# Patient Record
Sex: Male | Born: 1960 | Race: White | Hispanic: No | Marital: Married | Smoking: Never smoker
Health system: Southern US, Community
[De-identification: ages and names within clinical notes are randomized; demographics above are authoritative.]

## PROBLEM LIST (undated history)

## (undated) DIAGNOSIS — G039 Meningitis, unspecified: Secondary | ICD-10-CM

## (undated) DIAGNOSIS — I10 Essential (primary) hypertension: Secondary | ICD-10-CM

---

## 2011-06-08 ENCOUNTER — Emergency Department (HOSPITAL_BASED_OUTPATIENT_CLINIC_OR_DEPARTMENT_OTHER)
Admission: EM | Admit: 2011-06-08 | Discharge: 2011-06-08 | Disposition: A | Discharge status: Home / Self Care | Payer: PRIVATE HEALTH INSURANCE | Attending: Emergency Medicine | Admitting: Emergency Medicine

## 2011-06-08 ENCOUNTER — Emergency Department (INDEPENDENT_AMBULATORY_CARE_PROVIDER_SITE_OTHER): Payer: PRIVATE HEALTH INSURANCE

## 2011-06-08 DIAGNOSIS — D72829 Elevated white blood cell count, unspecified: Secondary | ICD-10-CM | POA: Insufficient documentation

## 2011-06-08 DIAGNOSIS — R079 Chest pain, unspecified: Secondary | ICD-10-CM

## 2011-06-08 DIAGNOSIS — R0789 Other chest pain: Secondary | ICD-10-CM | POA: Insufficient documentation

## 2011-06-08 DIAGNOSIS — E785 Hyperlipidemia, unspecified: Secondary | ICD-10-CM | POA: Insufficient documentation

## 2011-06-08 DIAGNOSIS — F17200 Nicotine dependence, unspecified, uncomplicated: Secondary | ICD-10-CM | POA: Insufficient documentation

## 2011-06-08 DIAGNOSIS — R000 Tachycardia, unspecified: Secondary | ICD-10-CM | POA: Insufficient documentation

## 2011-06-08 DIAGNOSIS — I10 Essential (primary) hypertension: Secondary | ICD-10-CM | POA: Insufficient documentation

## 2011-06-08 LAB — COMPREHENSIVE METABOLIC PANEL
ALT: 42 U/L (ref 0–53)
AST: 25 U/L (ref 0–37)
Alkaline Phosphatase: 87 U/L (ref 39–117)
CO2: 25 mEq/L (ref 19–32)
Chloride: 95 mEq/L — ABNORMAL LOW (ref 96–112)
GFR calc Af Amer: >60 mL/min (ref >60)
GFR calc non Af Amer: >60 mL/min (ref >60)
Glucose, Bld: 115 mg/dL — ABNORMAL HIGH (ref 70–99)
Potassium: 3.7 mEq/L (ref 3.5–5.1)
Sodium: 133 mEq/L — ABNORMAL LOW (ref 135–145)
Total Bilirubin: 0.2 mg/dL — ABNORMAL LOW (ref 0.3–1.2)

## 2011-06-08 LAB — DIFFERENTIAL
Eosinophils Relative: 0 % (ref 0–5)
Lymphocytes Relative: 21 % (ref 12–46)
Lymphs Abs: 3 10*3/uL (ref 0.7–4.0)
Neutro Abs: 9.9 10*3/uL — ABNORMAL HIGH (ref 1.7–7.7)

## 2011-06-08 LAB — CBC
Hemoglobin: 14.3 g/dL (ref 13.0–17.0)
MCH: 32.9 pg (ref 26.0–34.0)
Platelets: 367 10*3/uL (ref 150–400)
RBC: 4.34 MIL/uL (ref 4.22–5.81)
WBC: 14.4 10*3/uL — ABNORMAL HIGH (ref 4.0–10.5)

## 2012-01-17 ENCOUNTER — Encounter (HOSPITAL_BASED_OUTPATIENT_CLINIC_OR_DEPARTMENT_OTHER): Payer: Self-pay | Admitting: *Deleted

## 2012-01-17 ENCOUNTER — Emergency Department (HOSPITAL_BASED_OUTPATIENT_CLINIC_OR_DEPARTMENT_OTHER)
Admission: EM | Admit: 2012-01-17 | Discharge: 2012-01-17 | Disposition: A | Discharge status: Home / Self Care | Payer: PRIVATE HEALTH INSURANCE | Attending: Emergency Medicine | Admitting: Emergency Medicine

## 2012-01-17 ENCOUNTER — Emergency Department (INDEPENDENT_AMBULATORY_CARE_PROVIDER_SITE_OTHER): Payer: PRIVATE HEALTH INSURANCE

## 2012-01-17 DIAGNOSIS — X838XXA Intentional self-harm by other specified means, initial encounter: Secondary | ICD-10-CM

## 2012-01-17 DIAGNOSIS — S52599A Other fractures of lower end of unspecified radius, initial encounter for closed fracture: Secondary | ICD-10-CM | POA: Insufficient documentation

## 2012-01-17 DIAGNOSIS — S5290XA Unspecified fracture of unspecified forearm, initial encounter for closed fracture: Secondary | ICD-10-CM | POA: Insufficient documentation

## 2012-01-17 DIAGNOSIS — S52609A Unspecified fracture of lower end of unspecified ulna, initial encounter for closed fracture: Secondary | ICD-10-CM

## 2012-01-17 DIAGNOSIS — Y92009 Unspecified place in unspecified non-institutional (private) residence as the place of occurrence of the external cause: Secondary | ICD-10-CM | POA: Insufficient documentation

## 2012-01-17 HISTORY — DX: Meningitis, unspecified: G03.9

## 2012-01-17 HISTORY — DX: Essential (primary) hypertension: I10

## 2012-01-17 MED ORDER — HYDROCODONE-ACETAMINOPHEN 5-325 MG PO TABS
2.0000 | ORAL_TABLET | Freq: Once | ORAL | Status: AC
  Administered 2012-01-17: 2 via ORAL
  Filled 2012-01-17: qty 2

## 2012-01-17 MED ORDER — HYDROCODONE-ACETAMINOPHEN 5-325 MG PO TABS: 2.0000 | ORAL_TABLET | ORAL | Status: AC | PRN

## 2012-01-17 NOTE — ED Notes (Signed)
Pt states he hit the refrigerator with his right hand. Now c/o pain in his right wrist. Hx breaks x 3 to same. +radial pulse. Moves fingers. Feels touch. Cap refill < 3sec. Splinted and ice applied at triage.

## 2012-01-17 NOTE — ED Notes (Signed)
Pt out to speak with RN staff.  Pt VERY rude and offensive to staff.  Explained to pt that we are very full and the EDP will see him when available.  Further explained that we had done all we could (xray, ice and splinting) until evaluated by EDP.  Pt again remains rude and hateful in tone and manner.  K. [place], [place] made aware of pts displeasure.

## 2012-01-17 NOTE — ED Provider Notes (Signed)
History     CSN: [id number]  Arrival date & time 01/17/12  1648   First MD Initiated Contact with Patient 01/17/12 1720      Chief Complaint  Patient presents with  . Wrist Injury    (Consider location/radiation/quality/duration/timing/severity/associated sxs/prior treatment) Patient is a 51 y.o. male presenting with wrist pain. The history is provided by the patient. No language interpreter was used.  Wrist Pain This is a new problem. The current episode started today. The problem occurs constantly. The problem has been unchanged. Associated symptoms include joint swelling. Pertinent negatives include no numbness. The symptoms are aggravated by bending. He has tried ice and immobilization for the symptoms. The treatment provided moderate relief.  Pt hit a refrigerator.  Pt complains of pain to his wrist and forearm  Past Medical History  Diagnosis Date  . Hypertension   . Meningitis spinal     History reviewed. No pertinent past surgical history.  History reviewed. No pertinent family history.  History  Substance Use Topics  . Smoking status: Never Smoker   . Smokeless tobacco: Not on file  . Alcohol Use: Yes      Review of Systems  Musculoskeletal: Positive for joint swelling.  Neurological: Negative for numbness.  All other systems reviewed and are negative.    Allergies  Penicillins  Home Medications   Current Outpatient Rx  Name Route Sig Dispense Refill  . LIPITOR PO Oral Take 1 tablet by mouth daily.    Marland Kitchen ESOMEPRAZOLE MAGNESIUM 40 MG PO CPDR Oral Take 40 mg by mouth daily before breakfast.    . PRESCRIPTION MEDICATION Oral Take 2 tablets by mouth daily. Blood pressure medication      BP 145/94  Pulse 94  Temp(Src) 97.3 F (36.3 C) (Oral)  Resp 20  Ht 5\' 10"  (1.778 m)  Wt 200 lb (90.719 kg)  BMI 28.70 kg/m2  SpO2 96%  Physical Exam  Nursing note and vitals reviewed. Constitutional: He appears well-developed. He appears distressed.  HENT:    Head: Normocephalic and atraumatic.  Musculoskeletal: He exhibits edema and tenderness.       Swollen tender right wrist,  nv and ns intact,  From wrist and fingers  Neurological: He is alert.  Skin: Skin is warm.  Psychiatric: He has a normal mood and affect.    ED Course  Procedures (including critical care time)  Labs Reviewed - No data to display Dg Wrist Complete Right  01/17/2012  *RADIOLOGY REPORT*  Clinical Data: The patient punched a refrigerator and now has wrist pain.  RIGHT WRIST - COMPLETE 3+ VIEW  Comparison: None.  Findings: Laurence Compton fracture of the distal radius noted, with some foreshortening of the radius compared to the ulna, fracture extending into the posterior jugular margin of the distal radius, and only mild proximal migration of the radial articular fragment and the hand.  There also appears to be a relatively nondisplaced longitudinal fracture plane extending proximally in the distal radius.  On the oblique view, there is a questionable fracture of the base of the ulnar styloid.  IMPRESSION:  1.  Minimally displaced Barton fracture of the distal radius 2.  Mild proximal longitudinal fracture of the distal radial metadiaphysis. 3.  Equivocal fracture at the base of the ulnar styloid.  Original Report Authenticated By: Dellia Cloud, M.D.     No diagnosis found.    MDM  Pt placed in splint. I advised follow jup with Hand surgeon for evaluation  Langston Masker, [place] 01/17/12 1925

## 2012-01-17 NOTE — ED Notes (Signed)
Pt has some swelling and limited movement to Right hand.wrist.  Pt reports injury to same after hitting it on the refrigerator.  Pt was given ice and splinting in triage

## 2012-01-18 NOTE — ED Provider Notes (Signed)
Medical screening examination/treatment/procedure(s) were performed by non-physician practitioner and as supervising physician I was immediately available for consultation/collaboration.   Forbes Cellar, MD 01/18/12 [phone]

## 2013-01-21 IMAGING — CR DG WRIST COMPLETE 3+V*R*
4 series · 4 of 4 positions shown · non-contrast
Comparison: None.

CLINICAL DATA: The patient punched a refrigerator and now has wrist
pain.

RIGHT WRIST - COMPLETE 3+ VIEW

[x wrist pa right]
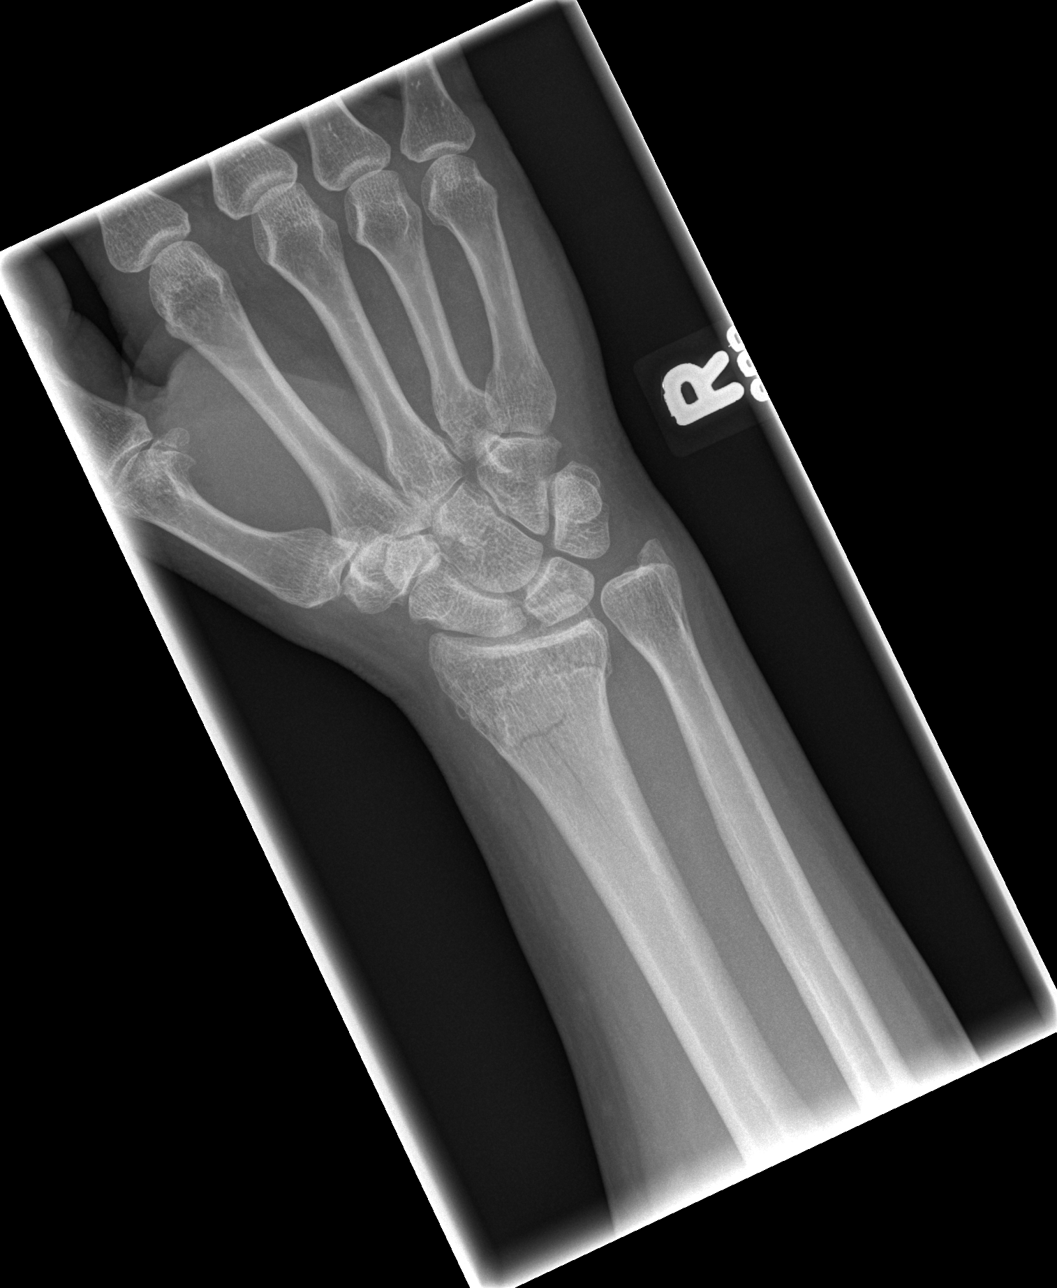

[x wrist obl right]
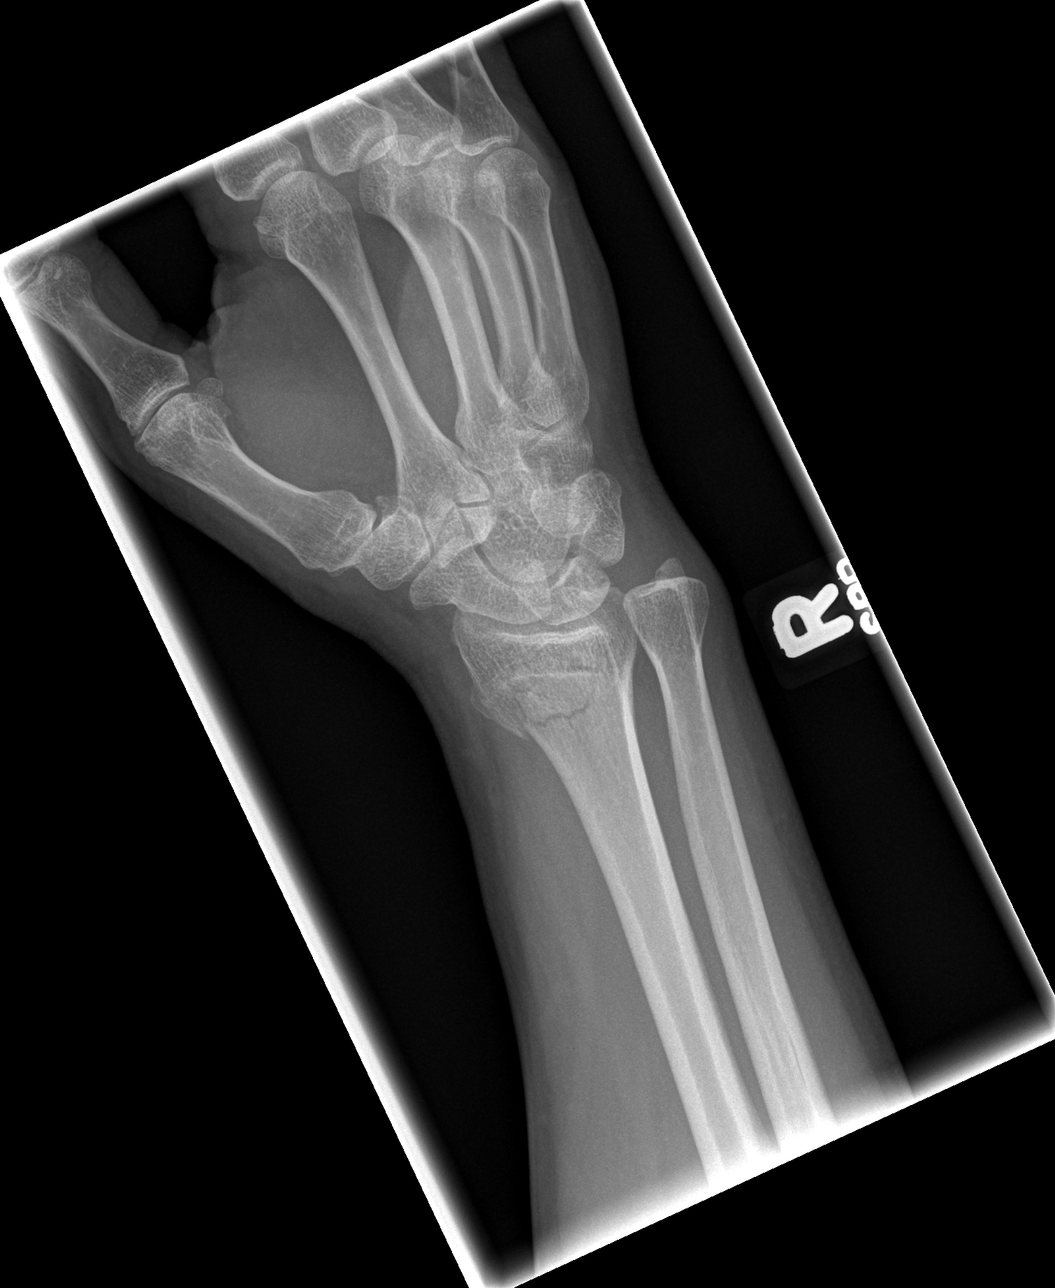

[x wrist lat right]
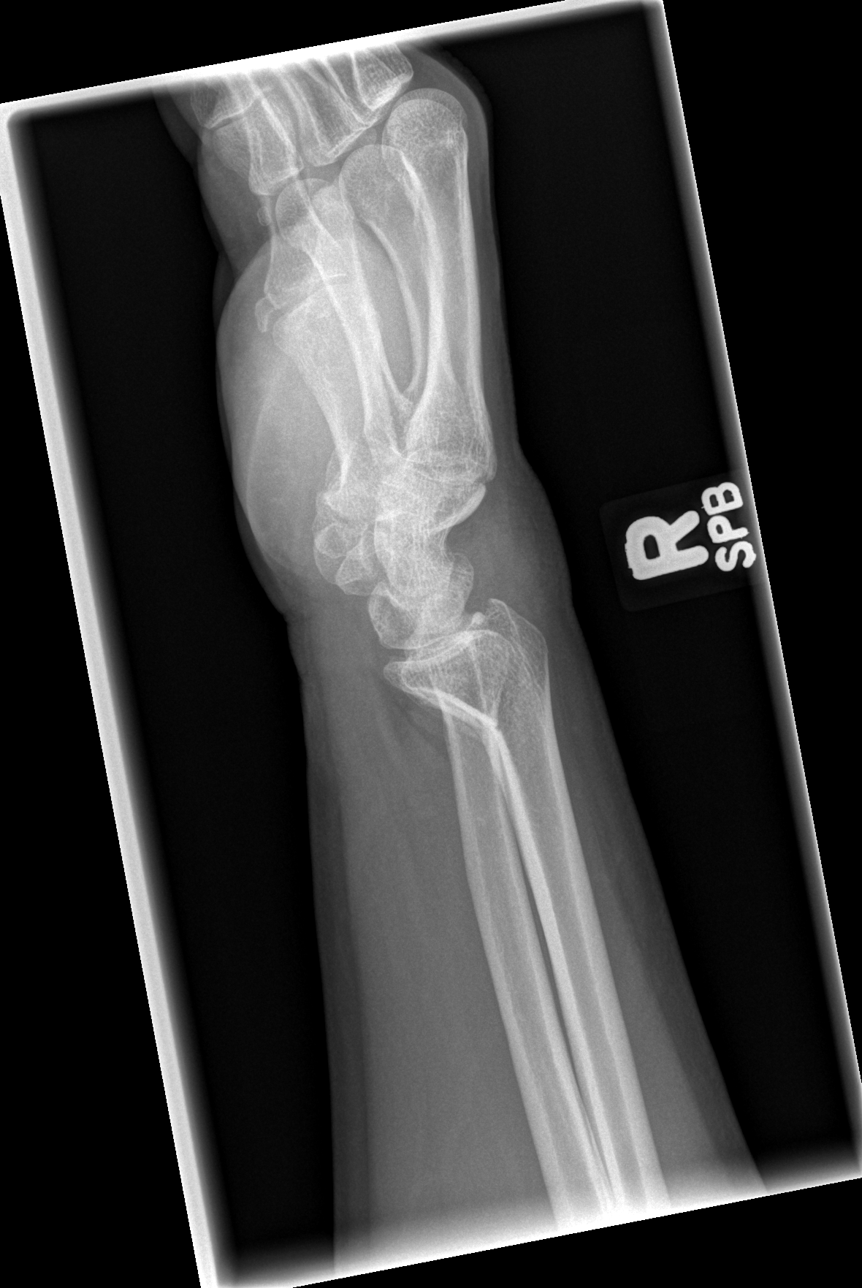

[x navicular]
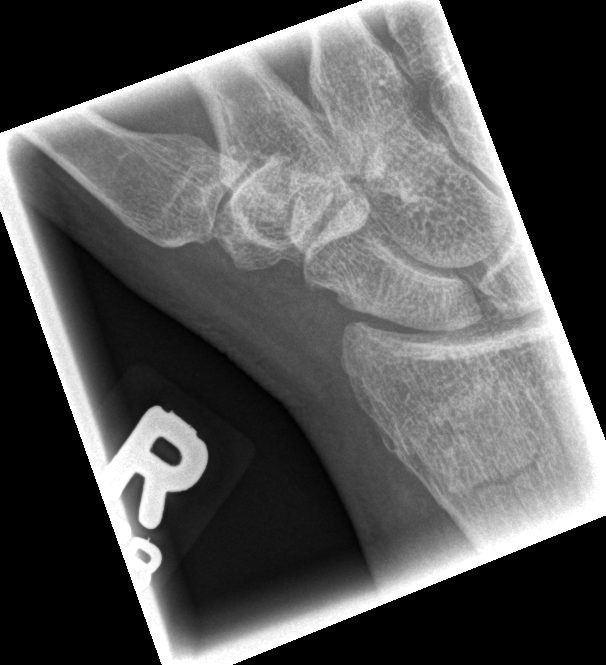

[4 of 4 positions shown; findings below may reference images not displayed]

FINDINGS: Locklear fracture of the distal radius noted, with some
foreshortening of the radius compared to the ulna, fracture
extending into the posterior jugular margin of the distal radius,
and only mild proximal migration of the radial articular fragment
and the hand.  There also appears to be a relatively nondisplaced
longitudinal fracture plane extending proximally in the distal
radius.

On the oblique view, there is a questionable fracture of the base
of the ulnar styloid.
IMPRESSION: 1.  Minimally displaced Locklear fracture of the distal radius
2.  Mild proximal longitudinal fracture of the distal radial
metadiaphysis.
3.  Equivocal fracture at the base of the ulnar styloid.

## 2021-09-03 ENCOUNTER — Other Ambulatory Visit: Payer: Self-pay

## 2021-09-03 ENCOUNTER — Encounter (HOSPITAL_COMMUNITY): Payer: Self-pay

## 2021-09-03 ENCOUNTER — Emergency Department (HOSPITAL_COMMUNITY): Payer: Managed Care, Other (non HMO)

## 2021-09-03 ENCOUNTER — Inpatient Hospital Stay (HOSPITAL_COMMUNITY)
Admission: EM | Admit: 2021-09-03 | Discharge: 2021-09-04 | DRG: 897 | Disposition: A | Discharge status: Home / Self Care | Payer: Managed Care, Other (non HMO) | Attending: Internal Medicine | Admitting: Internal Medicine

## 2021-09-03 DIAGNOSIS — D72829 Elevated white blood cell count, unspecified: Secondary | ICD-10-CM | POA: Diagnosis present

## 2021-09-03 DIAGNOSIS — Y900 Blood alcohol level of less than 20 mg/100 ml: Secondary | ICD-10-CM | POA: Diagnosis present

## 2021-09-03 DIAGNOSIS — Z79899 Other long term (current) drug therapy: Secondary | ICD-10-CM | POA: Diagnosis not present

## 2021-09-03 DIAGNOSIS — I10 Essential (primary) hypertension: Secondary | ICD-10-CM | POA: Diagnosis present

## 2021-09-03 DIAGNOSIS — R053 Chronic cough: Secondary | ICD-10-CM | POA: Diagnosis present

## 2021-09-03 DIAGNOSIS — E119 Type 2 diabetes mellitus without complications: Secondary | ICD-10-CM | POA: Diagnosis present

## 2021-09-03 DIAGNOSIS — R569 Unspecified convulsions: Secondary | ICD-10-CM | POA: Diagnosis present

## 2021-09-03 DIAGNOSIS — K219 Gastro-esophageal reflux disease without esophagitis: Secondary | ICD-10-CM | POA: Diagnosis present

## 2021-09-03 DIAGNOSIS — G4089 Other seizures: Secondary | ICD-10-CM | POA: Diagnosis present

## 2021-09-03 DIAGNOSIS — Z8661 Personal history of infections of the central nervous system: Secondary | ICD-10-CM

## 2021-09-03 DIAGNOSIS — E785 Hyperlipidemia, unspecified: Secondary | ICD-10-CM | POA: Diagnosis present

## 2021-09-03 DIAGNOSIS — Z833 Family history of diabetes mellitus: Secondary | ICD-10-CM | POA: Diagnosis not present

## 2021-09-03 DIAGNOSIS — G43909 Migraine, unspecified, not intractable, without status migrainosus: Secondary | ICD-10-CM | POA: Diagnosis present

## 2021-09-03 DIAGNOSIS — Z87891 Personal history of nicotine dependence: Secondary | ICD-10-CM | POA: Diagnosis not present

## 2021-09-03 DIAGNOSIS — F10988 Alcohol use, unspecified with other alcohol-induced disorder: Secondary | ICD-10-CM | POA: Diagnosis present

## 2021-09-03 DIAGNOSIS — J449 Chronic obstructive pulmonary disease, unspecified: Secondary | ICD-10-CM | POA: Diagnosis present

## 2021-09-03 DIAGNOSIS — H9191 Unspecified hearing loss, right ear: Secondary | ICD-10-CM | POA: Diagnosis present

## 2021-09-03 DIAGNOSIS — Z20822 Contact with and (suspected) exposure to covid-19: Secondary | ICD-10-CM | POA: Diagnosis present

## 2021-09-03 LAB — RESP PANEL BY RT-PCR (FLU A&B, COVID) ARPGX2
Influenza A by PCR: NEGATIVE
Influenza B by PCR: NEGATIVE
SARS Coronavirus 2 by RT PCR: NEGATIVE

## 2021-09-03 LAB — CBC WITH DIFFERENTIAL/PLATELET
Abs Immature Granulocytes: 0.06 10*3/uL (ref 0.00–0.07)
Basophils Absolute: 0.1 10*3/uL (ref 0.0–0.1)
Basophils Relative: 1 %
Eosinophils Absolute: 0.2 10*3/uL (ref 0.0–0.5)
Eosinophils Relative: 1 %
HCT: 39.8 % (ref 39.0–52.0)
Hemoglobin: 13.7 g/dL (ref 13.0–17.0)
Immature Granulocytes: 1 %
Lymphocytes Relative: 16 %
Lymphs Abs: 2 10*3/uL (ref 0.7–4.0)
MCH: 33.7 pg (ref 26.0–34.0)
MCHC: 34.4 g/dL (ref 30.0–36.0)
MCV: 98 fL (ref 80.0–100.0)
Monocytes Absolute: 1 10*3/uL (ref 0.1–1.0)
Monocytes Relative: 8 %
Neutro Abs: 9.4 10*3/uL — ABNORMAL HIGH (ref 1.7–7.7)
Neutrophils Relative %: 73 %
Platelets: 282 10*3/uL (ref 150–400)
RBC: 4.06 MIL/uL — ABNORMAL LOW (ref 4.22–5.81)
RDW: 13 % (ref 11.5–15.5)
WBC: 12.7 10*3/uL — ABNORMAL HIGH (ref 4.0–10.5)
nRBC: 0 % (ref 0.0–0.2)

## 2021-09-03 LAB — COMPREHENSIVE METABOLIC PANEL WITH GFR
ALT: 45 U/L — ABNORMAL HIGH (ref 0–44)
AST: 29 U/L (ref 15–41)
Albumin: 3.8 g/dL (ref 3.5–5.0)
Alkaline Phosphatase: 64 U/L (ref 38–126)
Anion gap: 8 (ref 5–15)
BUN: 9 mg/dL (ref 6–20)
CO2: 22 mmol/L (ref 22–32)
Calcium: 8.8 mg/dL — ABNORMAL LOW (ref 8.9–10.3)
Chloride: 105 mmol/L (ref 98–111)
Creatinine, Ser: 0.78 mg/dL (ref 0.61–1.24)
GFR, Estimated: >60 mL/min
Glucose, Bld: 118 mg/dL — ABNORMAL HIGH (ref 70–99)
Potassium: 4.1 mmol/L (ref 3.5–5.1)
Sodium: 135 mmol/L (ref 135–145)
Total Bilirubin: 0.6 mg/dL (ref 0.3–1.2)
Total Protein: 6.5 g/dL (ref 6.5–8.1)

## 2021-09-03 LAB — TSH: TSH: 1.29 u[IU]/mL (ref 0.350–4.500)

## 2021-09-03 LAB — HEMOGLOBIN A1C
Hgb A1c MFr Bld: 6.5 % — ABNORMAL HIGH (ref 4.8–5.6)
Mean Plasma Glucose: 139.85 mg/dL

## 2021-09-03 LAB — ETHANOL: Alcohol, Ethyl (B): <10 mg/dL (ref <10)

## 2021-09-03 MED ORDER — ENOXAPARIN SODIUM 40 MG/0.4ML IJ SOSY
40.0000 mg | PREFILLED_SYRINGE | INTRAMUSCULAR | Status: DC
  Administered 2021-09-03: 40 mg via SUBCUTANEOUS
  Filled 2021-09-03: qty 0.4

## 2021-09-03 MED ORDER — KETOROLAC TROMETHAMINE 15 MG/ML IJ SOLN: 15.0000 mg | Freq: Four times a day (QID) | INTRAMUSCULAR | Status: DC | PRN

## 2021-09-03 MED ORDER — DIPHENHYDRAMINE HCL 50 MG/ML IJ SOLN: 25.0000 mg | Freq: Four times a day (QID) | INTRAMUSCULAR | Status: DC | PRN

## 2021-09-03 MED ORDER — PROCHLORPERAZINE EDISYLATE 10 MG/2ML IJ SOLN: 5.0000 mg | Freq: Four times a day (QID) | INTRAMUSCULAR | Status: DC | PRN

## 2021-09-03 MED ORDER — ACETAMINOPHEN 500 MG PO TABS
1000.0000 mg | ORAL_TABLET | Freq: Once | ORAL | Status: AC
  Administered 2021-09-03: 1000 mg via ORAL
  Filled 2021-09-03: qty 2

## 2021-09-03 MED ORDER — SODIUM CHLORIDE 0.9 % IV SOLN
3000.0000 mg | Freq: Once | INTRAVENOUS | Status: AC
  Administered 2021-09-03: 3000 mg via INTRAVENOUS
  Filled 2021-09-03: qty 30

## 2021-09-03 NOTE — ED Provider Notes (Signed)
Pt's care assumed at 3pm.  Pt has been seen by Neurology for evaluation of seizures.  MRi and EEG ordered.  Mri no acute abnormality/ 7:00pm  Pt had a third seizure.  Approx 45 seconds.   Dr. Bernette Mayers in to see.  Pt loaded with Keppra    Osie Cheeks 09/03/21 1911    Pollyann Savoy, MD 09/03/21 2014

## 2021-09-03 NOTE — Progress Notes (Signed)
EEG complete - results pending 

## 2021-09-03 NOTE — ED Notes (Signed)
Pt resting with wife at bedside. Lovenox given to pt. Pt states headache 9.5/10. requesting something for headache as well as requesting something to eat and drink. Advised pt that his diet order are NPO  so unable to provide anything to eat or drink. Offered mouth swabs to pt and he declined. Internal med provider aware of pt request for medication for headache.

## 2021-09-03 NOTE — ED Notes (Signed)
Pt complaining of headache 9/10. Md made aware.

## 2021-09-03 NOTE — ED Notes (Signed)
Pt resting in bed comfortably on R side. Pt asked me to turn light out and crack door. I asked pt if he needed to pee (had went to the restroom earlier, but didn't use a urinal), but he said he didn't. Given ice water. Pt denies further needs.

## 2021-09-03 NOTE — Consult Note (Addendum)
Neurology Consultation  Reason for Consult: Seizure-like activity Referring Physician: Dr. Particia Nearing  CC: Seizure-like activity at home  History is obtained from: Patient, Patient's wife, Chart reivew  HPI: Leonard Boone is a right-handed 60 y.o. 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 Patient states that he and his wife work from home and while he was working today, she noticed Leonard Boone turn his head towards the right, vocalize loudly in a high pitch, and then began shaking all of his extremities with urine incontinence. His wife is unable to estimate an amount of time that he had seizure-like activity and that he did not return to baseline before he began to uncontrollably shake again. His wife notes that he was stomping on the ground with his feet, foaming at the mouth, and that his eyes had rolled back in his head during these events. Leonard Boone states that he was not feeling well yesterday but this was nonspecific and he states he may have just felt fatigued from a recent trip to [place], otherwise, he denies any recent illness. He denies any history of seizures, any family history of seizures, and any history of head trauma. He does drink alcohol regularly and states that he sometimes binge drinks and other times, he does not. He last had alcohol last night and denies history of alcohol withdrawal. Denies any substance abuse at this time.   ROS: A complete ROS was performed and is negative except as noted in the HPI.   Past Medical History:  Diagnosis Date   Hypertension    Meningitis spinal   History reviewed. No pertinent surgical history.  No family history on file.  Social History:   reports current alcohol use. He reports that he does  not use drugs. No history on file for tobacco use.  Medications No current facility-administered medications for this encounter.  Current Outpatient Medications:    atorvastatin (LIPITOR) 80 MG tablet, Take 80 mg by mouth daily., Disp: , Rfl:    esomeprazole (NEXIUM) 40 MG capsule, Take 40 mg by mouth every morning. , Disp: , Rfl:    lisinopril-hydrochlorothiazide (PRINZIDE,ZESTORETIC) 10-12.5 MG per tablet, Take 2 tablets by mouth daily., Disp: , Rfl:   Exam: Current vital signs: BP 131/89   Pulse 68   Temp 97.8 F (36.6 C) (Oral)   Resp 14   SpO2 93%  Vital signs in last 24 hours: Temp:  [97.8 F (36.6 C)] 97.8 F (36.6 C) (09/14 1303) Pulse Rate:  [68-89] 68 (09/14 1430) Resp:  [14-20] 14 (09/14 1430) BP: (126-139)/(84-95) 131/89 (09/14 1430) SpO2:  [93 %-94 %] 93 % (09/14 1430)  GENERAL: Awake, alert, in no acute distress Psych: Affect appropriate for situation, patient is calm and cooperative with examination Head: Normocephalic and atraumatic, without obvious abnormality EENT: Normal conjunctivae, dry mucous membranes, no OP obstruction LUNGS: Normal respiratory effort. Non-labored breathing on room air CV: Regular rate and rhythm on telemetry ABDOMEN: Soft, non-tender, non-distended Extremities: warm, well perfused, without obvious deformity  NEURO:  Mental Status: Awake, alert, and oriented to person, place, time, and situation. He is amnestic to events leading up to hospitalization.  Speech/Language: speech is intact without dysarthria or aphasia.   No neglect is noted Cranial Nerves:  II: PERRL. Visual fields full.  III,  IV, VI: EOMI without gaze preference or ptosis.  V: Sensation is intact to light touch and symmetrical to face.  VII: Face is symmetric resting and smiling.  VIII: Hearing is intact to voice IX, X: Phonation normal.  XI: Head is midline XII: Tongue protrudes midline. Motor: Moves all extremities spontaneously without noted asymmetry.  Tone  is normal. Bulk is normal.  Sensation: Intact to light touch bilaterally in all four extremities.  Gait: Deferred  Labs I have reviewed labs in epic and the results pertinent to this consultation are: CBC    Component Value Date/Time   WBC 12.7 (H) 09/03/2021 1324   RBC 4.06 (L) 09/03/2021 1324   HGB 13.7 09/03/2021 1324   HCT 39.8 09/03/2021 1324   PLT 282 09/03/2021 1324   MCV 98.0 09/03/2021 1324   MCH 33.7 09/03/2021 1324   MCHC 34.4 09/03/2021 1324   RDW 13.0 09/03/2021 1324   LYMPHSABS 2.0 09/03/2021 1324   MONOABS 1.0 09/03/2021 1324   EOSABS 0.2 09/03/2021 1324   BASOSABS 0.1 09/03/2021 1324   CMP     Component Value Date/Time   NA 135 09/03/2021 1324   K 4.1 09/03/2021 1324   CL 105 09/03/2021 1324   CO2 22 09/03/2021 1324   GLUCOSE 118 (H) 09/03/2021 1324   BUN 9 09/03/2021 1324   CREATININE 0.78 09/03/2021 1324   CALCIUM 8.8 (L) 09/03/2021 1324   PROT 6.5 09/03/2021 1324   ALBUMIN 3.8 09/03/2021 1324   AST 29 09/03/2021 1324   ALT 45 (H) 09/03/2021 1324   ALKPHOS 64 09/03/2021 1324   BILITOT 0.6 09/03/2021 1324   GFRNONAA >60 09/03/2021 1324   GFRAA >60 06/08/2011 1915   Lipid Panel  No results found for: CHOL, TRIG, HDL, CHOLHDL, VLDL, LDLCALC, LDLDIRECT  Alcohol Level    Component Value Date/Time   [facility] <10 09/03/2021 1310    Imaging I have reviewed the images obtained:  CT-scan of the brain 09/03/2021: 1. Normal noncontrast head CT.  Assessment: 60 y.o. male without a history of seizures who presented with 2 witnessed seizure episodes at home described by his wife as as GTC seizures with rightward head turn, eyes rolled back, foaming at the mouth, urinary incontinence, and without return to baseline between episodes. - Exam is non focal with return to patient baseline on neurology evaluation.  - Presentation is concerning for new onset seizure. Will obtain MRI brain and routine EEG for further evaluation. Patient with leukocytosis on  presentation, likely reactive but will complete infectious work up for further evaluation of new onset seizure etiology.  - Will defer initiating AED therapy at this time with first time seizure pending further work up with EEG and MRI brain results. Will consider initiating AED therapy if seizure focus identified on work up. Only concerning history is spinal meningitis at age 65.   Impression: New onset seizure activity   Recommendations: - MRI brain without contrast pending - Routine EEG pending - Initiate seizure precautions - Infectious work up: UDS, UA, CXR - Serum TSH, A1c pending  - 2 mg IV Versed PRN for any seizure lasting over 5 minutes and notify neurology - Discussed [facility] statutes, patients with seizures are not allowed to drive until they have been seizure-free for six months - Will need further education regarding home safety: Use caution when using heavy equipment or power tools. Avoid working on ladders or at heights. Take showers instead of baths. Ensure the water temperature is not too high  on the home water heater. Do not go swimming alone. Do not lock yourself in a room alone (i.e. bathroom). When caring for infants or small children, sit down when holding, feeding, or changing them to minimize risk of injury to the child in the event you have a seizure. Maintain good sleep hygiene. Avoid alcohol.   Lanae Boast, AGAC-NP [facility] Pager: [phone]  Patient seen, examined, labs,vitals and notes reviewed. Discussed plan with Cindie Laroche, NP and agree with assessment and plan as documented above. I have independently reviewed the chart, obtained history, review of systems and examined the patient.  Patient had third seizure and  will be admitted. Load Keppra 3,000mg  IV and continue 750mg  bid for now.    Electronically signed by:  , MD Page: Marisue Humble 09/03/2021, 7:34 PM

## 2021-09-03 NOTE — ED Provider Notes (Signed)
[facility] EMERGENCY DEPARTMENT Provider Note   CSN: [id number] Arrival date & time: 09/03/21  1257     History Chief Complaint  Patient presents with   Seizures    Leonard Boone is a 60 y.o. male.  60 year old male with history of hypertension and hyperlipidemia brought in by EMS from home with report of seizure today.  Patient states that he and his wife work from home, he is a [employer]ar[employer] manager, was sitting at his desk and the next thing he remembers is still sitting in his chair but with EMS personnel standing around him.  Patient was told that he had 2 seizures.  He has no history of prior seizures.  Patient was able to ambulate to the ambulance with assistance, states that he did feel dizzy/unsteady at first however this is improved.  Reports feeling nauseous with a frontal headache. Did loose bladder control, did not bite tongue.  Patient had a recent trip to [place], no other travel, no recent illnesses or fevers.  Patient drinks a few beers daily, has not recently stopped drinking, denies illicit drug use. No other complaints or concerns.       Past Medical History:  Diagnosis Date   Hypertension    Meningitis spinal     There are no problems to display for this patient.   History reviewed. No pertinent surgical history.     No family history on file.  Social History   Tobacco Use   Smoking status: Never  Substance Use Topics   Alcohol use: Yes   Drug use: No    Home Medications Prior to Admission medications   Medication Sig Start Date End Date Taking? Authorizing Provider  atorvastatin (LIPITOR) 80 MG tablet Take 80 mg by mouth daily.    [provider]  esomeprazole (NEXIUM) 40 MG capsule Take 40 mg by mouth every morning.     [provider]  lisinopril-hydrochlorothiazide (PRINZIDE,ZESTORETIC) 10-12.5 MG per tablet Take 2 tablets by mouth daily.    [provider]    Allergies    Penicillins  Review  of Systems   Review of Systems  Constitutional:  Negative for chills, diaphoresis and fever.  Respiratory:  Negative for shortness of breath.   Cardiovascular:  Negative for chest pain.  Gastrointestinal:  Positive for nausea. Negative for abdominal pain and vomiting.  Genitourinary:  Negative for difficulty urinating.  Musculoskeletal:  Negative for arthralgias and myalgias.  Skin:  Negative for rash and wound.  Allergic/Immunologic: Negative for immunocompromised state.  Neurological:  Positive for dizziness, seizures and headaches. Negative for weakness and numbness.  Psychiatric/Behavioral:  Negative for confusion.   All other systems reviewed and are negative.  Physical Exam Updated Vital Signs BP 131/89   Pulse 68   Temp 97.8 F (36.6 C) (Oral)   Resp 14   SpO2 93%   Physical Exam Vitals and nursing note reviewed.  Constitutional:      General: He is not in acute distress.    Appearance: He is well-developed. He is not diaphoretic.  HENT:     Head: Normocephalic and atraumatic.     Nose: Nose normal.     Mouth/Throat:     Mouth: Mucous membranes are moist.  Eyes:     Extraocular Movements: Extraocular movements intact.     Conjunctiva/sclera: Conjunctivae normal.     Pupils: Pupils are equal, round, and reactive to light.  Cardiovascular:     Rate and Rhythm: Normal rate and regular rhythm.  Heart sounds: Normal heart sounds.  Pulmonary:     Effort: Pulmonary effort is normal.     Breath sounds: Normal breath sounds.  Abdominal:     Palpations: Abdomen is soft.     Tenderness: There is no abdominal tenderness.  Musculoskeletal:     Cervical back: Neck supple.     Right lower leg: No edema.     Left lower leg: No edema.  Skin:    General: Skin is warm and dry.     Findings: No erythema or rash.  Neurological:     Mental Status: He is alert and oriented to person, place, and time.     Cranial Nerves: No cranial nerve deficit.     Sensory: No sensory  deficit.     Motor: No weakness.  Psychiatric:        Behavior: Behavior normal.    ED Results / Procedures / Treatments   Labs (all labs ordered are listed, but only abnormal results are displayed) Labs Reviewed  COMPREHENSIVE METABOLIC PANEL - Abnormal; Notable for the following components:      Result Value   Glucose, Bld 118 (*)    Calcium 8.8 (*)    ALT 45 (*)    All other components within normal limits  CBC WITH DIFFERENTIAL/PLATELET - Abnormal; Notable for the following components:   WBC 12.7 (*)    RBC 4.06 (*)    Neutro Abs 9.4 (*)    All other components within normal limits  ETHANOL  RAPID URINE DRUG SCREEN, HOSP PERFORMED  URINALYSIS, ROUTINE W REFLEX MICROSCOPIC    EKG EKG Interpretation  Date/Time:  Wednesday September 03 2021 13:01:29 EDT Ventricular Rate:  88 PR Interval:  182 QRS Duration: 103 QT Interval:  361 QTC Calculation: 437 R Axis:   50 Text Interpretation: Sinus rhythm No significant change since last tracing Confirmed by Jacalyn Lefevre [phone]) on 09/03/2021 1:11:26 PM  Radiology CT Head Wo Contrast  Result Date: 09/03/2021 CLINICAL DATA:  Seizure. EXAM: CT HEAD WITHOUT CONTRAST TECHNIQUE: Contiguous axial images were obtained from the base of the skull through the vertex without intravenous contrast. COMPARISON:  None. FINDINGS: Brain: No evidence of acute infarction, hemorrhage, hydrocephalus, extra-axial collection or mass lesion/mass effect. Vascular: No hyperdense vessel or unexpected calcification. Skull: Normal. Negative for fracture or focal lesion. Sinuses/Orbits: No acute finding. Other: None. IMPRESSION: 1. Normal noncontrast head CT. Electronically Signed   By: Obie Dredge M.D.   On: 09/03/2021 14:01    Procedures Procedures   Medications Ordered in ED Medications - No data to display  ED Course  I have reviewed the triage vital signs and the nursing notes.  Pertinent labs & imaging results that were available during my  care of the patient were reviewed by me and considered in my medical decision making (see chart for details).  Clinical Course as of 09/03/21 1501  Wed Sep 03, 2021  [id number] 60 year old male brought in by EMS from home for concern for seizure-like activity.  Patient's wife is at bedside now, states that patient was sitting in his desk facing her working when he suddenly had a hard gaze to the right in his chair started to turn to the right followed by an abnormal noise from his mouth and shaking of his arms and legs.  States the episode lasted for a few minutes, seemed to stop shaking for a moment and then shaking resumed.  Episode in total lasted less than 5 minutes, followed by postictal state.  Patient was able to ambulate with assistance to the stretcher and was transported to the ER without further event.  Patient did lose bladder control, did not bite his tongue.  History as above. Exam is unremarkable. CT head unremarkable, CBC with mild leukocytosis of 12.7, possibly secondary to his seizure episode today.  CMP without significant electrolyte abnormality.  Alcohol negative, UDS and UA pending.   Vitals show mildly hypertensive blood pressure 131/89, room air O2 sat 93%. Case discussed with Dr. Particia Nearing, ER attending who has seen the patient, recommends consult with neurology.  Case discussed with Dr. Thomasena Edis, neuro hospitalist who will see the patient. [LM]  1501 Care signed out at change of shift pending neuro evaluation.  [LM]    Clinical Course User Index [LM] Alden Hipp   MDM Rules/Calculators/A&P                           Final Clinical Impression(s) / ED Diagnoses Final diagnoses:  None    Rx / DC Orders ED Discharge Orders     None        Jeannie Fend, PA-C 09/03/21 1501    Jacalyn Lefevre, MD 09/04/21 [phone]

## 2021-09-03 NOTE — ED Notes (Signed)
Patient transported to CT 

## 2021-09-03 NOTE — Plan of Care (Signed)
Paged by primary team for headache management recommendations.  Per flowsheets, nursing notes, patient originally had 9 out of 10 headache which is improved to 7 out of 10 subsequently down to 5 out of 10 after 1 g of Tylenol.  Per last nursing note patient is resting comfortably.  Post seizure headaches are not uncommon and can be treated with standard headache treatments; they do frequently have migrainous features and therefore it is reasonable to try migraine cocktail  Recommendations: Migraine cocktails (PRN every 6 hours ordered together: Benadryl 25-50 mg AND ketorolac 15-30 mg AND Compazine 5-10 mg; scheduled every 12 hours: 1 L normal saline and 2 g magnesium sulfate) Use should not exceed 3 days to avoid medication overuse headache Additionally given alcohol use history, recommend CIWA protocol to avoid withdrawal complications including further seizures  Brooke Dare MD-PhD [facility] [phone]  Available 7 PM to 7 AM, outside of these hours please call Neurologist on call as listed on Amion.

## 2021-09-03 NOTE — ED Notes (Signed)
Got patient undressed into a gown on the monitor did ekg shown to Dr Particia Nearing patient is resting with call bell in reach

## 2021-09-03 NOTE — ED Notes (Addendum)
Pt post-ictal. Oriented to person, place and time. Disoriented to situation. Still fidgety, but more awake than previously. Seizure pads remain in place. Wife at bedside.

## 2021-09-03 NOTE — ED Triage Notes (Signed)
Pt arrived from home by EMS after 2 witnessed seizures, denies h of seizures  On arrival pt A&Ox4. C/o headache, nausea and dizziness.  Denies recent trauma   H HTN

## 2021-09-03 NOTE — H&P (Addendum)
Date: 09/03/2021               Patient Name:  Leonard Boone MRN: [id number]  DOB: 01-Dec-1961 Age / Sex: 60 y.o., male   PCP: Burnis Medin, PA-C         Medical Service: Internal Medicine Teaching Service         Attending Physician: Dr. Reymundo Poll, MD    First Contact: Ellison Carwin, MD Pager: [phone]  Second Contact: Marolyn Haller, MD Pager: [phone]       After Hours (After 5p/  First Contact Pager: [phone]  weekends / holidays): Second Contact Pager: [phone]   SUBJECTIVE   Chief Complaint: New onset seizures  History of Present Illness:   Leonard Boone is a 60 y/o M with a PMHx of tobacco use disorder, HTN, HLD, history of spinal meningitis as a child, COPD, GERD, and diet controlled T2DM who presented to the ED after an ictal event. Patient and his wife work from home and they sit facing each other. At about 10:30 or 11 am patient's wife states patient he suddenly reached his left arm up to the sky and he made a shrieking sound. The patient was in his office chair, his eyes roled to the back of his head, his back arched and he began shaking. He lost control of his bladder during this episode and urinated himself. Patient's wife is uncertain as to how long the seizure lasted but they called EMS. His wife yelled for help and she and her daughter were able to keep him from falling out of his chair. The patient has no memory of this episode. The last thing he remember is waking up surrounded by EMS. While in route, the patient endorses being severely nauseated.  Wife endorses patient not feeling well yesterday, but she attributed this to him staying up late two evenings prior. Denies anything out of the ordinary the past few days. Denies fever, chills, headaches leading up to this event. Has Hx of COPD and chronic cough but denies recent increase in sputum production or increased shortness of breath than normal. Denies rashes, tick bites.  Denies feeling odd prior to the  episode. 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 Had spinal meningitis when he was 60 y/o and notes having "permanent nerve damage." No family members in the home have recently been ill. Recently flew back from [place] but this was several weeks ago.   ED Course: Patient came in via EMS, CT head and MRI brain obtained, patient was seen by neurology and was initially going to be discharged, however patient had another seizure in the ED and was deemed appropriate for admission  Meds:  Current Meds  Medication Sig   amLODipine (NORVASC) 10 MG tablet Take 10 mg by mouth daily.   atorvastatin (LIPITOR) 80 MG tablet Take 80 mg by mouth daily.   metoprolol succinate (TOPROL-XL) 100 MG 24 hr tablet Take 100 mg by mouth daily.   pantoprazole (PROTONIX) 40 MG tablet Take 40 mg by mouth daily.   umeclidinium-vilanterol (ANORO ELLIPTA) 62.5-25 MCG/INH AEPB Inhale 1 puff into the lungs daily.    Past Medical History:  Diagnosis Date   Hypertension    Meningitis spinal     History reviewed. No pertinent surgical history.  Social:  Lives With: wife and family Occupation: Quarry manager Support: family at home Level of Function: able to perform all of his ADL's independently  PCP: Fulbright PA Substances:  Tobacco use for the past 40 yrs 3/4-1 PPD, Alcohol use varies from 3-4 drinks a day to 6-7 a day, usually budweiser or angry orchard. Last alcohol beverage was yesterday evening. Denies illicit substance use.   Family History:  Mother - cardiac disease with heart transplant. Uncertain as to why Father - DM Siblings - Brother had thick walled heart that led to heart transplant  Denies family history of seizures or strokes  Allergies: Allergies as of 09/03/2021 - Review Complete 09/03/2021  Allergen Reaction Noted   Penicillins Other (See Comments) 01/17/2012    Review of Systems: A complete ROS was negative except as per HPI.   OBJECTIVE:    Physical Exam: Blood pressure 131/76, pulse 77, temperature 97.8 F (36.6 C), resp. rate 16, SpO2 95 %.  Constitutional: obese well-appearing man laying in bed in no acute distress HENT: normocephalic atraumatic, mucous membranes moist Eyes: conjunctiva non-erythematous, PERRL, EOMI Neck: supple Cardiovascular: regular rate and rhythm, no m/r/g Pulmonary/Chest: normal work of breathing on room air, lungs clear to auscultation bilaterally Abdominal: soft, non-tender, non-distended MSK: normal bulk and tone Neurological: alert & oriented x 4, 5/5 strength in bilateral upper and lower extremities, normal gait, cranial nerves 2-12 intact, sensation intact, normal coordination, no focal deficits Skin: warm and dry Psych: normal affect  Labs: CBC    Component Value Date/Time   WBC 12.7 (H) 09/03/2021 1324   RBC 4.06 (L) 09/03/2021 1324   HGB 13.7 09/03/2021 1324   HCT 39.8 09/03/2021 1324   PLT 282 09/03/2021 1324   MCV 98.0 09/03/2021 1324   MCH 33.7 09/03/2021 1324   MCHC 34.4 09/03/2021 1324   RDW 13.0 09/03/2021 1324   LYMPHSABS 2.0 09/03/2021 1324   MONOABS 1.0 09/03/2021 1324   EOSABS 0.2 09/03/2021 1324   BASOSABS 0.1 09/03/2021 1324     CMP     Component Value Date/Time   NA 135 09/03/2021 1324   K 4.1 09/03/2021 1324   CL 105 09/03/2021 1324   CO2 22 09/03/2021 1324   GLUCOSE 118 (H) 09/03/2021 1324   BUN 9 09/03/2021 1324   CREATININE 0.78 09/03/2021 1324   CALCIUM 8.8 (L) 09/03/2021 1324   PROT 6.5 09/03/2021 1324   ALBUMIN 3.8 09/03/2021 1324   AST 29 09/03/2021 1324   ALT 45 (H) 09/03/2021 1324   ALKPHOS 64 09/03/2021 1324   BILITOT 0.6 09/03/2021 1324   GFRNONAA >60 09/03/2021 1324   GFRAA >60 06/08/2011 1915    Imaging: CT Head Wo Contrast  Result Date: 09/03/2021 CLINICAL DATA:  Seizure. EXAM: CT HEAD WITHOUT CONTRAST TECHNIQUE: Contiguous axial images were obtained from the base of the skull through the vertex without intravenous contrast.  COMPARISON:  None. FINDINGS: Brain: No evidence of acute infarction, hemorrhage, hydrocephalus, extra-axial collection or mass lesion/mass effect. Vascular: No hyperdense vessel or unexpected calcification. Skull: Normal. Negative for fracture or focal lesion. Sinuses/Orbits: No acute finding. Other: None. IMPRESSION: 1. Normal noncontrast head CT. Electronically Signed   By: Obie Dredge M.D.   On: 09/03/2021 14:01   MR BRAIN WO CONTRAST  Result Date: 09/03/2021 CLINICAL DATA:  Seizure EXAM: MRI HEAD WITHOUT CONTRAST TECHNIQUE: Multiplanar, multiecho pulse sequences of the brain and surrounding structures were obtained without intravenous contrast. COMPARISON:  Same day CT head FINDINGS: Brain: There is no evidence of acute intracranial hemorrhage, extra-axial fluid collection, or acute infarct. The ventricles are normal in size. Scattered foci of FLAIR signal abnormality in the subcortical and periventricular white matter are nonspecific  but likely reflect sequelae of mild chronic white matter microangiopathy. There is no mass lesion. There is no midline shift. The hippocampal formations are symmetric and normal in appearance. There is no structural or migration abnormality. Vascular: Normal flow voids. Skull and upper cervical spine: Normal marrow signal. Sinuses/Orbits: The paranasal sinuses are clear. The globes and orbits are unremarkable. Other: There is a right mastoid effusion. IMPRESSION: 1. No acute intracranial pathology or epileptogenic focus identified. 2. Mild chronic white matter microangiopathy. 3. Right mastoid effusion. Electronically Signed   By: Lesia Hausen M.D.   On: 09/03/2021 17:31    EKG: personally reviewed my interpretation is Sinus rhythm, normal axis, normal interval, unchanged from prior EKG   ASSESSMENT & PLAN:    Assessment & Plan by Problem: Active Problems:   Seizure [facility])   Leonard Boone is a 60 y.o. with pertinent PMH of tobacco use disorder, HTN, HLD, spinal  meningitis as a child, COPD, GERD, and diet controlled T2DM who is being admitted for new onset seizures.  #New Onset Generalized tonic clonic seizures Patient had a witnessed ictal event this morning followed by a second episode around 7 PM in the ED. No tongue biting but did have urinary incontinence. Upon interview patient had returned back to baseline with no focal deficits. 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 Does report a moderate alcohol use daily but denies every having symptoms of withdrawals. Also denies recently stopping alcohol consumption, patient had normal alcohol intake the day prior to seizure. Denies sick contacts, denies fever, new medications, or rashes. Neurology is on board and recommended infectious workup, UDS, EEG, and has started keppra load. Unclear at this time what the underlying cause is. CT head normal. MRI negative for acute intracranial pathology or epileptogenic focus. Could be infectious vs toxin vs new onset epilepsy. - neurology following, appreciate assistance - Load Keppra 3,000mg  IV and continue 750mg  bid  - f/u UDS, UA, CXR - seizure precautions - 2 mg IV versed for seizure >5 min - TSH WNL, A1c 6.5 - discuss alcohol cessation due to increased risk of further seizures  #Post Ictal Migraine Spoke with neurology for post-ictal headache recommendations. Headache improved slightly after tylenol, put in for migraine cocktail Q12 PRN -  compazine 5 mg,  diphenhydramine 25 mg, ketorolac 15 mg   #Tobacco Use #Alcohol Use Patient reports moderate alcohol intake and will likely need further screening for AUD with new seizure activity. Last drink the evening of 9/13. - smokes 0.75-1 PPD on average - consider nicotine patch - cessation counseling - CIWA  #Leukocytosis Suspect this is reactive secondary to patient's seizure - daily CBC  #COPD First Spirometry 07/05/18 ratio 55% FEV1 38% FVC 55%  PFT 08/19/18  ratio 68% was Gold stage 3. Second spirometry in 09/01/2018 FEV1 73% FVC 84% mild obstruction with increased RV Gold stage 1. Patient does not use oxygen at home, but was put on 2L [place] after second ictal episode. - wean O2 as able. - continue anoro ellipta inhaler  #Diet controlled DMII A1c 6.5 - SSI  #HTN - continue home amlodipine 10 mg daily - continue metoprolol succinate 100 mg daily   #GERD -Continue home protonix 40 mg daily  #Hyperlipidemia -Continue home atorvastatin 80 daily  Diet: Normal VTE: Enoxaparin IVF: None,None Code: Full  Prior to Admission Living Arrangement: Home, living with wife and step-daughter Anticipated Discharge Location: Home Barriers to Discharge: Continued medical workup of seizure  Dispo: Admit patient to Inpatient with expected length of stay greater than 2 midnights.  Signed: Ilene Qua, MD Internal Medicine Resident PGY-1 Pager: [phone]  09/03/2021, 11:34 PM

## 2021-09-04 ENCOUNTER — Inpatient Hospital Stay (HOSPITAL_COMMUNITY): Payer: Managed Care, Other (non HMO)

## 2021-09-04 DIAGNOSIS — R569 Unspecified convulsions: Secondary | ICD-10-CM | POA: Diagnosis not present

## 2021-09-04 LAB — URINALYSIS, ROUTINE W REFLEX MICROSCOPIC
Bacteria, UA: NONE SEEN
Bilirubin Urine: NEGATIVE
Glucose, UA: NEGATIVE mg/dL
Hgb urine dipstick: NEGATIVE
Ketones, ur: NEGATIVE mg/dL
Leukocytes,Ua: NEGATIVE
Nitrite: NEGATIVE
Protein, ur: 30 mg/dL — AB
Specific Gravity, Urine: 1.017 (ref 1.005–1.030)
pH: 5 (ref 5.0–8.0)

## 2021-09-04 LAB — COMPREHENSIVE METABOLIC PANEL
ALT: 39 U/L (ref 0–44)
AST: 34 U/L (ref 15–41)
Albumin: 3.5 g/dL (ref 3.5–5.0)
Alkaline Phosphatase: 66 U/L (ref 38–126)
Anion gap: 9 (ref 5–15)
BUN: 7 mg/dL (ref 6–20)
CO2: 23 mmol/L (ref 22–32)
Calcium: 8.7 mg/dL — ABNORMAL LOW (ref 8.9–10.3)
Chloride: 104 mmol/L (ref 98–111)
Creatinine, Ser: 0.82 mg/dL (ref 0.61–1.24)
GFR, Estimated: >60 mL/min (ref >60)
Glucose, Bld: 105 mg/dL — ABNORMAL HIGH (ref 70–99)
Potassium: 3.7 mmol/L (ref 3.5–5.1)
Sodium: 136 mmol/L (ref 135–145)
Total Bilirubin: 0.8 mg/dL (ref 0.3–1.2)
Total Protein: 6 g/dL — ABNORMAL LOW (ref 6.5–8.1)

## 2021-09-04 LAB — HIV ANTIBODY (ROUTINE TESTING W REFLEX): HIV Screen 4th Generation wRfx: NONREACTIVE

## 2021-09-04 LAB — RAPID URINE DRUG SCREEN, HOSP PERFORMED
Amphetamines: NOT DETECTED
Barbiturates: NOT DETECTED
Benzodiazepines: NOT DETECTED
Cocaine: NOT DETECTED
Opiates: NOT DETECTED
Tetrahydrocannabinol: NOT DETECTED

## 2021-09-04 LAB — CBC WITH DIFFERENTIAL/PLATELET
Abs Immature Granulocytes: 0.05 10*3/uL (ref 0.00–0.07)
Basophils Absolute: 0.1 10*3/uL (ref 0.0–0.1)
Basophils Relative: 0 %
Eosinophils Absolute: 0.1 10*3/uL (ref 0.0–0.5)
Eosinophils Relative: 1 %
HCT: 40.9 % (ref 39.0–52.0)
Hemoglobin: 14 g/dL (ref 13.0–17.0)
Immature Granulocytes: 0 %
Lymphocytes Relative: 30 %
Lymphs Abs: 3.7 10*3/uL (ref 0.7–4.0)
MCH: 33.6 pg (ref 26.0–34.0)
MCHC: 34.2 g/dL (ref 30.0–36.0)
MCV: 98.1 fL (ref 80.0–100.0)
Monocytes Absolute: 1.2 10*3/uL — ABNORMAL HIGH (ref 0.1–1.0)
Monocytes Relative: 9 %
Neutro Abs: 7.5 10*3/uL (ref 1.7–7.7)
Neutrophils Relative %: 60 %
Platelets: 272 10*3/uL (ref 150–400)
RBC: 4.17 MIL/uL — ABNORMAL LOW (ref 4.22–5.81)
RDW: 13.1 % (ref 11.5–15.5)
WBC: 12.6 10*3/uL — ABNORMAL HIGH (ref 4.0–10.5)
nRBC: 0 % (ref 0.0–0.2)

## 2021-09-04 LAB — MAGNESIUM: Magnesium: 2.1 mg/dL (ref 1.7–2.4)

## 2021-09-04 MED ORDER — ACETAMINOPHEN 500 MG PO TABS: 1000.0000 mg | ORAL_TABLET | Freq: Four times a day (QID) | ORAL | Status: DC | PRN

## 2021-09-04 MED ORDER — LEVETIRACETAM 750 MG PO TABS
750.0000 mg | ORAL_TABLET | Freq: Two times a day (BID) | ORAL | Status: DC
  Administered 2021-09-04: 750 mg via ORAL
  Filled 2021-09-04 (×2): qty 1

## 2021-09-04 MED ORDER — LEVETIRACETAM 750 MG PO TABS: 750.0000 mg | ORAL_TABLET | Freq: Two times a day (BID) | ORAL | 2 refills | Status: AC

## 2021-09-04 NOTE — ED Notes (Signed)
Breakfast Ordered 

## 2021-09-04 NOTE — Discharge Instructions (Signed)
Leonard Boone because of your new onset seizures you were started on a medication called Keppra to hopefully help prevent these from happening again. It is important that you take this medicine every day and that you follow up with the neurologist as scheduled.   Regarding your drinking it is important that you stop drinking as this can potentially lead to seizures. We have included information on how to get help with this.   Because you had seizure:  - [facility] statutes, patients with seizures are not allowed to drive until you have been seizure-free for six months - Use caution when using heavy equipment or power tools. Avoid working on ladders or at heights. Take showers instead of baths. Ensure the water temperature is not too high on the home water heater. Do not go swimming alone. Do not lock yourself in a room alone (i.e. bathroom). When caring for infants or small children, sit down when holding, feeding, or changing them to minimize risk of injury to the child in the event you have a seizure. Maintain good sleep hygiene. Avoid alcohol

## 2021-09-04 NOTE — Procedures (Signed)
Patient Name: Leonard Boone  MRN: [id number]  Epilepsy Attending: Charlsie Quest  Referring Physician/Provider: Lanae Boast, NP Date: 09/03/2021 Duration: 22.22 mins  Patient history: 60 year old male with history of seizures presented with 2 witnessed seizure-like episodes.  EEG to evaluate for seizures.qwq  Level of alertness: Awake, asleep  AEDs during EEG study: LEV  Technical aspects: This EEG study was done with scalp electrodes positioned according to the 10-20 International system of electrode placement. Electrical activity was acquired at a sampling rate of 500Hz  and reviewed with a high frequency filter of 70Hz  and a low frequency filter of 1Hz . EEG data were recorded continuously and digitally stored.   Description: The posterior dominant rhythm consists of 9 Hz activity of moderate voltage (25-35 uV) seen predominantly in posterior head regions, symmetric and reactive to eye opening and eye closing. Sleep was characterized by vertex waves, sleep spindles (12 to 14 Hz), maximal frontocentral region. Hyperventilation and photic stimulation were not performed.     IMPRESSION: This study is within normal limits. No seizures or epileptiform discharges were seen throughout the recording.  Ailed Defibaugh 

## 2021-09-04 NOTE — ED Notes (Signed)
Pt resting in stretcher with eyes closed, no distress noted, VSS. Call light in reach.

## 2021-09-04 NOTE — ED Notes (Signed)
Report given to Linda, RN.

## 2021-09-04 NOTE — Plan of Care (Signed)
Neurology Plan of Care  EEG 9/14 IMPRESSION: This study is within normal limits. No seizures or epileptiform discharges were seen throughout the recording.  Due to multiple seizures with one witnessed seizure in the ED, patient was loaded with Keppra 3,000 mg and initiated on Keppra 750 mg PO BID.  Will continue Keppra with outpatient neurology follow up for further evaluation and management.  If UDS positive or UA with evidence of infection, treat appropriately and can consider discontinuation of AED in the outpatient setting.  MRI reviewed with attending MD, negative for evidence of seizure focus.  No further neurology recommendations at this time, please contact neurology for further questions or concerns.  Discussed plan of care with attending MD, Dr. Thomasena Edis who is in agreement.   Lanae Boast, AGACNP-BC [facility] [phone]

## 2021-09-04 NOTE — Discharge Summary (Addendum)
Name: Leonard Boone MRN: [id number] DOB: [birth year] 60 y.o. PCP: Gillian Scarce  Date of Admission: 09/03/2021 12:57 PM Date of Discharge: 09/04/2021 12:20 PM Attending Physician: Dr. Antony Contras  Discharge Diagnosis: 1. New onset seizures  Discharge Medications: Allergies as of 09/04/2021       Reactions   Penicillins Other (See Comments)   High fever        Medication List     TAKE these medications    amLODipine 10 MG tablet Commonly known as: NORVASC Take 10 mg by mouth daily.   Anoro Ellipta 62.5-25 MCG/INH Aepb Generic drug: umeclidinium-vilanterol Inhale 1 puff into the lungs daily.   atorvastatin 80 MG tablet Commonly known as: LIPITOR Take 80 mg by mouth daily.   levETIRAcetam 750 MG tablet Commonly known as: KEPPRA Take 1 tablet (750 mg total) by mouth 2 (two) times daily.   metoprolol succinate 100 MG 24 hr tablet Commonly known as: TOPROL-XL Take 100 mg by mouth daily.   pantoprazole 40 MG tablet Commonly known as: PROTONIX Take 40 mg by mouth daily.        Disposition and follow-up:   Leonard Boone was discharged from [facility] in Stable condition.  At the hospital follow up visit please address:  1.  Ensure patient taking Keppra as scheduled, Discuss alcohol use and willingness to stop, recently diagnosed diabetes (A1c 6.5 this hospitalization).   2.  Labs / imaging needed at time of follow-up: None   3.  Pending labs/ test needing follow-up: None   Follow-up Appointments:   Hospital Course by problem list: Leonard Boone is a 60 y.o. with pertinent PMH of tobacco use disorder, HTN, HLD, spinal meningitis as a child, COPD, GERD, and diet controlled T2DM who was admitted for new onset seizures.  1. New onset Generalized tonic clonic seizures  Patient presented to the ED after experiencing generalized tonic-clonic seizure.  Patient did not have any tongue biting but did have urinary  incontinence.  He had another seizure while in the ED. Unclear underlying etiology of his seizures though his alcohol intake is likely the primary driver.  Patient reported daily alcohol use fluctuating between 4 to 6 drinks daily up to 10-12 beers daily. His intermittent decrease and increase in alcohol use  could be contributing. Though he noted he had not recently stopped or reduced his alcohol consumption prior to his seizure.  Patient CT head was normal as well as his MRI brain.  His urine drug screen was negative and he denied any symptoms of infection. He also denied new medications or rashes.  EEG obtained this hospitalization showed no evidence of seizure or epileptiform activity.  Patient was loaded with 3 g of Keppra and continued on 750 mg twice daily per neurology.  On the day of discharge he had no further episodes of seizure activity and his neurological exam was within normal limits.  Patient will discharge on Keppra 750 mg twice daily and follow-up with neurology in outpatient setting.  Patient was encouraged to stop drinking as this definitely puts him at increased risk for additional seizures.  He was offered resources regarding this.  In addition it was mentioned that he should follow-up with his primary care doctor regarding this.  On the day of discharge, patient had no further episodes of seizure activity and stated that he is at his baseline state of health.  He denies nausea, vomiting, headache.  He is eager for discharge.  Discharge Exam:  BP 120/79 (BP Location: Right Arm)   Pulse 72   Temp 98 F (36.7 C) (Oral)   Resp 15   Ht 5\' 10"  (1.778 m)   Wt 131.5 kg   SpO2 96%   BMI 41.61 kg/m  Discharge exam:   Gen: NAD Cardiovascular: RRR, no m/r/g Pulm: Non labored breathing on room air, no wheezing or crackles Abdominal: Soft, nontender, nondistended MSK: Normal bulk and tone Neuro: Alert and oriented x4, 5/5 strength in bilateral upper and lower extremities, CN 2-12  intact Skin: warm, dry  Pertinent Labs, Studies, and Procedures:  CBC Latest Ref Rng & Units 09/04/2021 09/03/2021 06/08/2011  WBC 4.0 - 10.5 K/uL 12.6(H) 12.7(H) 14.4(H)  Hemoglobin 13.0 - 17.0 g/dL 06/10/2011 74.1 28.7  Hematocrit 39.0 - 52.0 % 40.9 39.8 40.1  Platelets 150 - 400 K/uL 272 282 367     BMP Latest Ref Rng & Units 09/04/2021 09/03/2021 06/08/2011  Glucose 70 - 99 mg/dL 06/10/2011) 672(C) 947(S)  BUN 6 - 20 mg/dL 7 9 21   Creatinine 0.61 - 1.24 mg/dL 962(E 3.66  Sodium 135 - 145 mmol/L 136 135 133(L)  Potassium 3.5 - 5.1 mmol/L 3.7 4.1 3.7  Chloride 98 - 111 mmol/L 104 105 95(L)  CO2 22 - 32 mmol/L 23 22 25   Calcium 8.9 - 10.3 mg/dL 2.94) 7.65)    Ethanol <10   Urine Drug screen Opiates NONE DETECTED NONE DETECTED   Cocaine NONE DETECTED NONE DETECTED   Benzodiazepines NONE DETECTED NONE DETECTED   Amphetamines NONE DETECTED NONE DETECTED   Tetrahydrocannabinol NONE DETECTED NONE DETECTED   Barbiturates NONE DETECTED NONE DETECTED   Comment: (NOTE)  DRUG SCREEN FOR MEDICAL PURPOSES  ONLY.  IF CONFIRMATION IS NEEDED  FOR ANY PURPOSE, NOTIFY LAB  WITHIN 5 DAYS.   LOWEST DETECTABLE LIMITS  FOR URINE DRUG SCREEN  Drug Class                     Cutoff (ng/mL)  Amphetamine and metabolites    1000  Barbiturate and metabolites    200  Benzodiazepine                 200  Tricyclics and metabolites     300  Opiates and metabolites        300  Cocaine and metabolites        300  THC                            50  Performed at [facility], [address], [place], [facility]  [address]     EEG 9/14:   Patient history: 60 year old male with history of seizures presented with 2 witnessed seizure-like episodes.  EEG to evaluate for seizures.qwq   Level of alertness: Awake, asleep   AEDs during EEG study: LEV   Technical aspects: This EEG study was done with scalp electrodes positioned according to the 10-20 International system of electrode placement.  Electrical activity was acquired at a sampling rate of 500Hz  and reviewed with a high frequency filter of 70Hz  and a low frequency filter of 1Hz . EEG data were recorded continuously and digitally stored.    Description: The posterior dominant rhythm consists of 9 Hz activity of moderate voltage (25-35 uV) seen predominantly in posterior head regions, symmetric and reactive to eye opening and eye closing. Sleep was characterized by vertex waves, sleep spindles (12 to 14 Hz),  maximal frontocentral region. Hyperventilation and photic stimulation were not performed.      IMPRESSION: This study is within normal limits. No seizures or epileptiform discharges were seen throughout the recording.  Signed: Marolyn Haller, MD PGY-2 09/04/2021, 1:58 PM   Internal Medicine  Pager [phone]

## 2021-09-04 NOTE — ED Notes (Signed)
Received verbal report from Amanda P RN at this time 

## 2021-09-04 NOTE — ED Notes (Signed)
Pt verbalized understanding of d/c instructions, meds and followup care. Denies questions. VSS, no distress noted. Steady gait to exit with wife. 

## 2021-09-04 NOTE — ED Notes (Signed)
Pt appears to be asleep. NAD noted. 

## 2021-09-04 NOTE — ED Notes (Signed)
Pt continues to appear to be asleep. NAD noted.

## 2021-11-02 ENCOUNTER — Other Ambulatory Visit: Payer: Self-pay | Admitting: Student

## 2022-09-09 IMAGING — DX DG CHEST 1V
1 series · 1 of 1 positions shown · non-contrast
Comparison: 06/08/2011

CLINICAL DATA: Seizure

EXAM:
CHEST  1 VIEW

[chest ap]
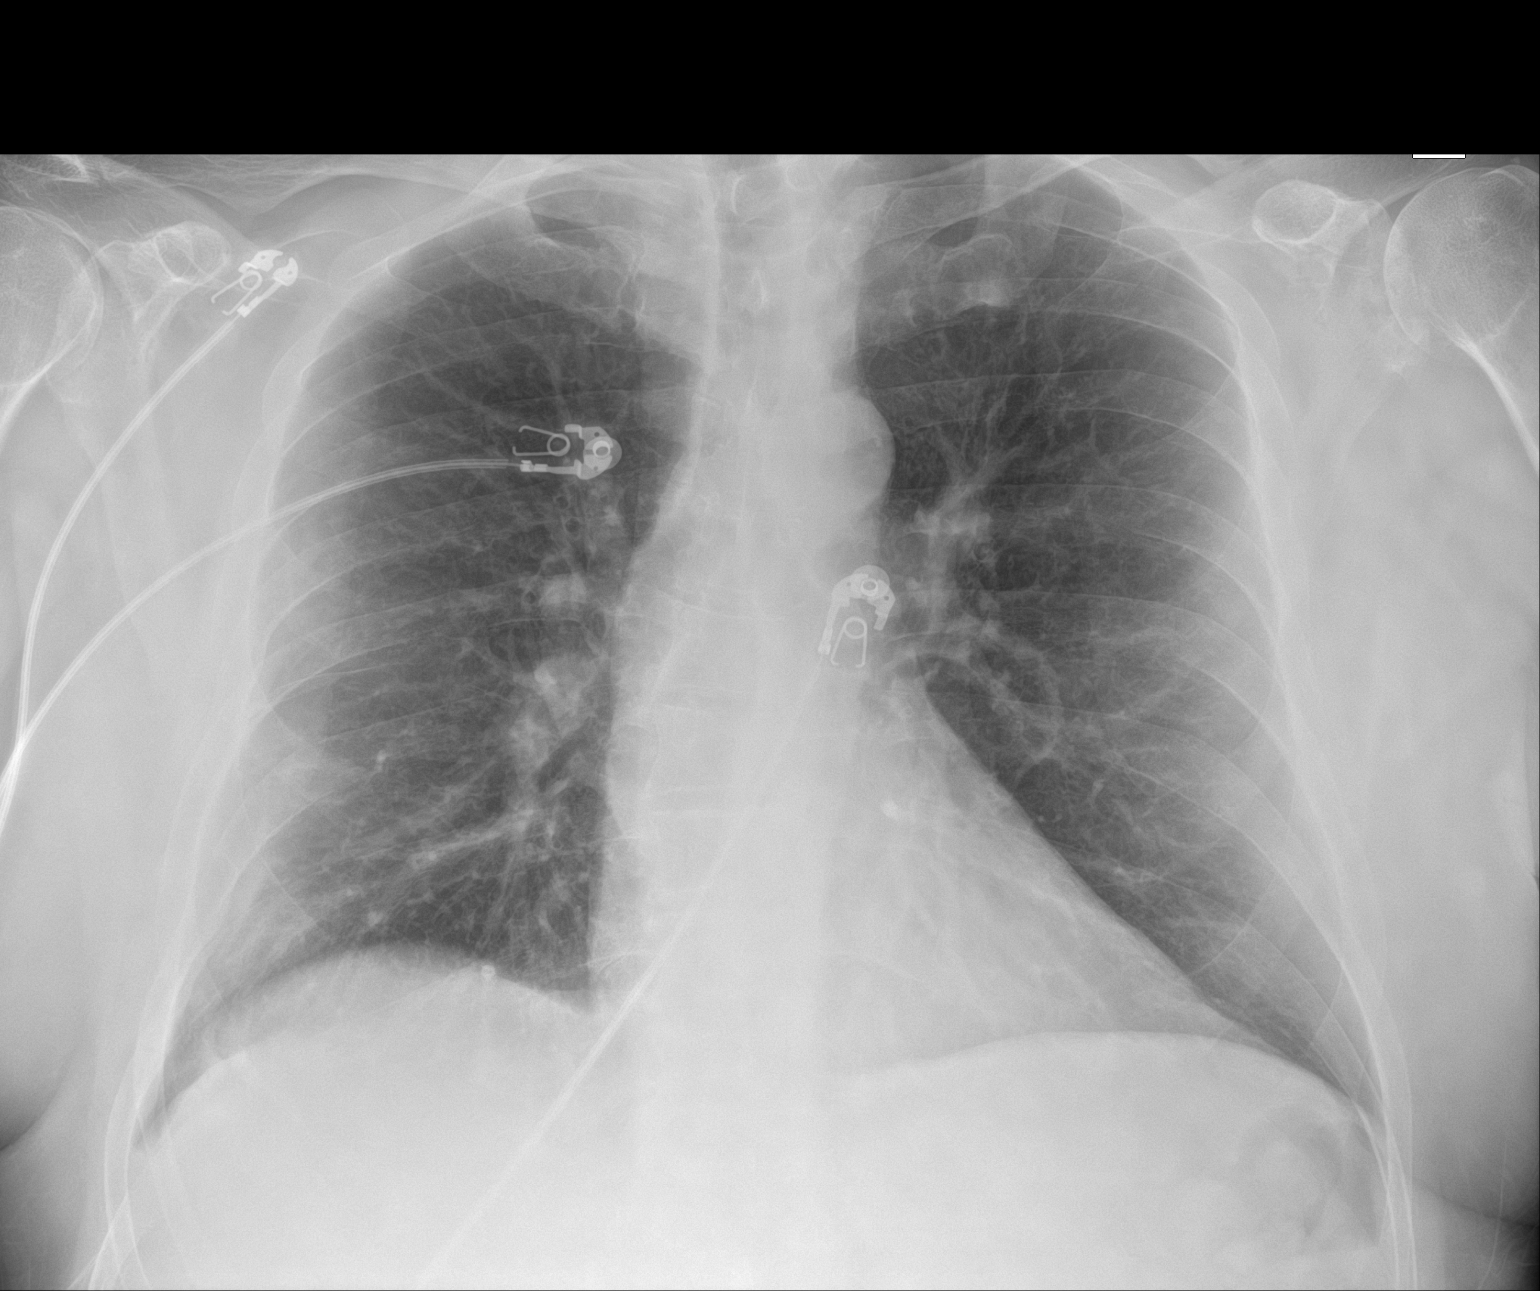

[1 of 1 positions shown; findings below may reference images not displayed]

FINDINGS: Normal heart size and mediastinal contours. Remote right rib
fracture/s. There is no edema, consolidation, effusion, or
pneumothorax.
IMPRESSION: No evidence of active disease.
